# Patient Record
Sex: Female | Born: 1990 | Race: Black or African American | Hispanic: No | Marital: Single | Smoking: Never smoker
Health system: Southern US, Community
[De-identification: ages and names within clinical notes are randomized; demographics above are authoritative.]

## PROBLEM LIST (undated history)

## (undated) DIAGNOSIS — R011 Cardiac murmur, unspecified: Secondary | ICD-10-CM

---

## 2018-04-18 NOTE — L&D Delivery Note (Addendum)
Date of delivery: 09/12/18 Estimated Date of Delivery: 09/04/18 No LMP recorded (lmp unknown). patient dated by 29wk ultrasound, LMP estimated 11/28/17 EGA: [redacted]w[redacted]d  Delivery Note At 2:55 PM a viable female was delivered via Vaginal, Spontaneous (Presentation: cephalic; LOA).  APGAR: 8, 9; weight 7 lb 5.5 oz (3330 g).   Placenta status: sponatneous, intact with trailing membranes removed completely.  Cord: 3vv, with the following complications: wrapped around body. Cord pH: not collected  Anesthesia: none (stadol during labor, lidocaine for repair)  Episiotomy: None Lacerations: 2nd degree;Perineal; Left sulcal Suture Repair: 3.0 vicryl vicryl rapide Est. Blood Loss (mL): 1292cc (measured) and primarily from vaginal laceration prior to delivery, not uterus.  Mom presented to L&D with induction of labor for past due date.  She was given cytotec x2, AROM'd for clear fluid, and then at 9.5cm, augmented with pitocin for protracted labor. Progressed to complete, second stage: 2hr 30 minutes.  Effective pushing when pushing efforts were maximized, which occurred irregularly. 3+ station for about 1 hour, with bleeding from vaginal laceration during second stage.  delivery of fetal head with restitution to LOT.Marland Kitchen   Anterior then posterior shoulders delivered without difficulty.  Body cord was unwound, and baby placed on mom's chest, and attended to by peds.   Cord was then clamped and cut when pulseless.  Placenta spontaneously delivered, and trailing membranes were removed in whole using ringed forceps.   IV pitocin given for hemorrhage prophylaxis. 3-0 vicryl rapide was used to repair a hemorrhagic left sulcal tear which was the main source of her postpartum bleeding until repair was complete. Uterine tone was firm throughout and did not hemorrhage. Shallow 2nd degree perineal was repaired with 3-0 vicryl.     We sang happy birthday to baby Anjel.   Mom to postpartum.  Baby to Couplet care / Skin to  Skin.  ----- Ranae Plumber, MD, FACOG Attending Obstetrician and Gynecologist [facility], Department of OB/GYN [facility]

## 2018-05-17 ENCOUNTER — Other Ambulatory Visit: Payer: Self-pay | Admitting: Family Medicine

## 2018-05-17 DIAGNOSIS — O4690 Antepartum hemorrhage, unspecified, unspecified trimester: Secondary | ICD-10-CM

## 2018-05-22 ENCOUNTER — Ambulatory Visit: Admission: RE | Admit: 2018-05-22 | Payer: Medicaid Other | Source: Ambulatory Visit

## 2018-05-29 ENCOUNTER — Other Ambulatory Visit: Payer: Self-pay

## 2018-05-29 ENCOUNTER — Emergency Department
Admission: EM | Admit: 2018-05-29 | Discharge: 2018-05-29 | Disposition: A | Discharge status: Home / Self Care | Payer: Medicaid Other | Attending: Student in an Organized Health Care Education/Training Program | Admitting: Student in an Organized Health Care Education/Training Program

## 2018-05-29 ENCOUNTER — Encounter: Payer: Self-pay | Admitting: Emergency Medicine

## 2018-05-29 DIAGNOSIS — Z3A00 Weeks of gestation of pregnancy not specified: Secondary | ICD-10-CM | POA: Diagnosis not present

## 2018-05-29 DIAGNOSIS — L02214 Cutaneous abscess of groin: Secondary | ICD-10-CM | POA: Insufficient documentation

## 2018-05-29 DIAGNOSIS — L0291 Cutaneous abscess, unspecified: Secondary | ICD-10-CM

## 2018-05-29 DIAGNOSIS — O99719 Diseases of the skin and subcutaneous tissue complicating pregnancy, unspecified trimester: Secondary | ICD-10-CM | POA: Insufficient documentation

## 2018-05-29 MED ORDER — MUPIROCIN 2 % EX OINT: TOPICAL_OINTMENT | CUTANEOUS | 0 refills | Status: DC

## 2018-05-29 MED ORDER — LIDOCAINE-PRILOCAINE 2.5-2.5 % EX CREA
TOPICAL_CREAM | Freq: Once | CUTANEOUS | Status: AC
  Administered 2018-05-29: 1 via TOPICAL

## 2018-05-29 MED ORDER — LIDOCAINE HCL (PF) 1 % IJ SOLN
5.0000 mL | Freq: Once | INTRAMUSCULAR | Status: DC
  Filled 2018-05-29: qty 5

## 2018-05-29 MED ORDER — LIDOCAINE 4 % EX CREA: TOPICAL_CREAM | Freq: Once | CUTANEOUS | Status: DC

## 2018-05-29 MED ORDER — LIDOCAINE-PRILOCAINE 2.5-2.5 % EX CREA
TOPICAL_CREAM | CUTANEOUS | Status: AC
  Administered 2018-05-29: 1 via TOPICAL
  Filled 2018-05-29: qty 5

## 2018-05-29 MED ORDER — CEPHALEXIN 500 MG PO CAPS: 500.0000 mg | ORAL_CAPSULE | Freq: Three times a day (TID) | ORAL | 0 refills | Status: AC

## 2018-05-29 NOTE — ED Triage Notes (Signed)
Pt w/ mother [facility] from [facility] with abscess to inner thigh x2 days, denies fever or drainage. PT mother states she is pregnant but unsure how far along, " maybe 4-795months" per mother . Mother is legal guardian

## 2018-05-29 NOTE — ED Provider Notes (Signed)
[facility] -  Emergency Department Provider Note    First MD Initiated Contact with Patient 05/29/18 1208     (approximate)  I have reviewed the triage vital signs and the nursing notes.   HISTORY  Chief Complaint Abscess    HPI Stacy Hess is a 28 y.o. female with some chronic intellectual disability presents from PCP clinic for evaluation of left groin abscess that is been there for several days.  No fevers.  Just complaining of mild to moderate pain.  Also was seeing her PCP for establishment of care in pregnancy.  She denies any pelvic pain at this point.  No bleeding.  Thinks that she is roughly [redacted] weeks pregnant.  History reviewed. No pertinent past medical history. No family history on file. History reviewed. No pertinent surgical history. There are no active problems to display for this patient.     Prior to Admission medications   Medication Sig Start Date End Date Taking? Authorizing Provider  cephALEXin (KEFLEX) 500 MG capsule Take 1 capsule (500 mg total) by mouth 3 (three) times daily for 7 days. 05/29/18 06/05/18  Willy Eddy, MD  mupirocin ointment Idelle Jo) 2 % Apply to affected area 3 times daily 05/29/18 05/29/19  Willy Eddy, MD    Allergies Patient has no known allergies.    Social History Social History   Tobacco Use  . Smoking status: Not on file  Substance Use Topics  . Alcohol use: Not on file  . Drug use: Not on file    Review of Systems Patient denies headaches, rhinorrhea, blurry vision, numbness, shortness of breath, chest pain, edema, cough, abdominal pain, nausea, vomiting, diarrhea, dysuria, fevers, rashes or hallucinations unless otherwise stated above in HPI. ____________________________________________   PHYSICAL EXAM:  VITAL SIGNS: Vitals:   05/29/18 1135  BP: 105/65  Pulse: (!) 103  Resp: 16  Temp: 98.1 F (36.7 C)  SpO2: 100%    Constitutional: Alert and oriented. Well appearing  and in no acute distress. Eyes: Conjunctivae are normal.  Head: Atraumatic. Nose: No congestion/rhinnorhea. Mouth/Throat: Mucous membranes are moist.   Neck: Painless ROM.  Cardiovascular:   Good peripheral circulation. Respiratory: Normal respiratory effort.  No retractions.  Gastrointestinal: Soft and nontender.  Gravid Musculoskeletal: No lower extremity tenderness .  No joint effusions. Neurologic:  Normal speech and language. No gross focal neurologic deficits are appreciated.  Skin:  Skin is warm, dry and intact 3 cm fluctuant abscess in the left groin area lateral to the vulva Psychiatric: Mood and affect are normal. Speech and behavior are normal.  ____________________________________________   LABS (all labs ordered are listed, but only abnormal results are displayed)  No results found for this or any previous visit (from the past 24 hour(s)). ____________________________________________ ____________________________________________  RADIOLOGY  EMERGENCY DEPARTMENT [place] PREGNANCY "Study: Limited Ultrasound of the Pelvis for Pregnancy"  INDICATIONS:Pregnancy(required) Multiple views of the uterus and pelvic cavity were obtained in real-time with a multi-frequency probe.  APPROACH:Transabdominal  PERFORMED BY: Myself IMAGES ARCHIVED?: yes LIMITATIONS: none PREGNANCY FREE FLUID: Present ADNEXAL FINDINGS: GESTATIONAL AGE, ESTIMATE:  FETAL HEART RATE: 150 INTERPRETATION: reassuring fetal heart rate.  Reassuring fetal movement     ____________________________________________   PROCEDURES  Procedure(s) performed:  Marland KitchenMarland KitchenIncision and Drainage Date/Time: 05/29/2018 1:43 PM Performed by: Willy Eddy, MD Authorized by: Willy Eddy, MD   Consent:    Consent obtained:  Verbal   Consent given by:  Patient   Risks discussed:  Bleeding, infection, incomplete drainage and pain   Alternatives  discussed:  Alternative treatment, delayed treatment and  observation Location:    Type:  Abscess   Location:  Lower extremity   Lower extremity location:  Leg   Leg location:  L upper leg Anesthesia (see MAR for exact dosages):    Anesthesia method:  Local infiltration   Local anesthetic:  Lidocaine 1% w/o epi Procedure type:    Complexity:  Complex Procedure details:    Incision types:  Stab incision   Incision depth:  Subcutaneous   Scalpel blade:  11   Wound management:  Probed and deloculated   Drainage:  Purulent   Drainage amount:  Moderate   Wound treatment:  Wound left open   Packing materials:  None Post-procedure details:    Patient tolerance of procedure:  Tolerated well, no immediate complications      Critical Care performed: no ____________________________________________   INITIAL IMPRESSION / ASSESSMENT AND PLAN / ED COURSE  Pertinent labs & imaging results that were available during my care of the patient were reviewed by me and considered in my medical decision making (see chart for details).  DDX: abscess, cellulitis, carbuncle, folliculitis  Stacy Hess is a 28 y.o. who presents to the ED with abscess to left groin.  Bedside ultrasound evaluate shows evidence of reassuring pregnancy.  She is not having any pelvic pain or bleeding.  Is established care with [facility] to clinic for further work-up of this.  Neuro visit to the ER today is regarding left groin abscess which was successfully incised and drained with moderate amount of purulent discharge.  Patient will be started on antibiotics.      ____________________________________________   FINAL CLINICAL IMPRESSION(S) / ED DIAGNOSES  Final diagnoses:  Abscess      NEW MEDICATIONS STARTED DURING THIS VISIT:  New Prescriptions   CEPHALEXIN (KEFLEX) 500 MG CAPSULE    Take 1 capsule (500 mg total) by mouth 3 (three) times daily for 7 days.   MUPIROCIN OINTMENT (BACTROBAN) 2 %    Apply to affected area 3 times daily     Note:  This document was  prepared using Dragon voice recognition software and may include unintentional dictation errors.     Willy Eddy, MD 05/29/18 1345

## 2018-05-29 NOTE — ED Notes (Signed)
RN called for patient in lobby, no response.

## 2018-05-29 NOTE — ED Notes (Signed)
Boil on leg needing to be drained per [facility] here via EMS. NAD.

## 2018-05-29 NOTE — ED Notes (Signed)
Discussed discharge instructions, prescriptions, and follow-up care with the patient and mother/legal guardian. No questions or concerns at this time. Pt stable at discharge.

## 2018-05-30 ENCOUNTER — Ambulatory Visit: Payer: Medicaid Other

## 2018-06-04 ENCOUNTER — Encounter: Payer: Self-pay | Admitting: *Deleted

## 2018-06-18 ENCOUNTER — Other Ambulatory Visit: Payer: Self-pay | Admitting: Family Medicine

## 2018-06-18 ENCOUNTER — Other Ambulatory Visit: Payer: Self-pay

## 2018-06-18 DIAGNOSIS — Z3490 Encounter for supervision of normal pregnancy, unspecified, unspecified trimester: Secondary | ICD-10-CM

## 2018-06-21 ENCOUNTER — Ambulatory Visit
Admission: RE | Admit: 2018-06-21 | Payer: Medicaid Other | Source: Ambulatory Visit | Attending: Family Medicine | Admitting: Family Medicine

## 2018-06-21 ENCOUNTER — Ambulatory Visit
Admission: RE | Admit: 2018-06-21 | Discharge: 2018-06-21 | Disposition: A | Discharge status: Home / Self Care | Payer: Medicaid Other | Source: Ambulatory Visit | Attending: Obstetrics and Gynecology | Admitting: Obstetrics and Gynecology

## 2018-06-21 DIAGNOSIS — Z3490 Encounter for supervision of normal pregnancy, unspecified, unspecified trimester: Secondary | ICD-10-CM

## 2018-06-21 DIAGNOSIS — O99419 Diseases of the circulatory system complicating pregnancy, unspecified trimester: Secondary | ICD-10-CM | POA: Insufficient documentation

## 2018-06-21 DIAGNOSIS — Q249 Congenital malformation of heart, unspecified: Secondary | ICD-10-CM

## 2018-06-21 HISTORY — DX: Cardiac murmur, unspecified: R01.1

## 2018-06-22 ENCOUNTER — Ambulatory Visit: Payer: Medicaid Other

## 2018-07-12 ENCOUNTER — Ambulatory Visit: Payer: Medicaid Other

## 2018-09-03 ENCOUNTER — Other Ambulatory Visit: Payer: Self-pay | Admitting: Certified Nurse Midwife

## 2018-09-03 ENCOUNTER — Encounter: Payer: Self-pay | Admitting: Certified Nurse Midwife

## 2018-09-03 NOTE — Progress Notes (Signed)
  Stacy Hess is a 28 y.o. G1P0 female at [redacted]w[redacted]d dated by [redacted]w[redacted]d ultrasound on 06/21/2018 with unknown LMP.    Pregnancy Issues: 1. Maternal ventral septal defect, seen by [facility] cardiology during pregnancy 2. Cognitive impairment 3. Anemia on iron 4. Rubella non-immune 5. Trich and gonorrhea 01/2018, treated; trich 07/2018, treated  [facility]ren[facility] care [facility]  Prenatal Labs: Blood type/Rh A+  Antibody screen neg  Rubella Non-immune  Varicella Immune  RPR NR  HBsAg Neg  HIV NR  GC neg  Chlamydia neg  Genetic screening Not performed  1 hour GTT 118  3 hour GTT n/a  GBS negative    Post Partum Planning: - Infant feeding: breast & formula - Contraception: Depo-Provera

## 2018-09-07 ENCOUNTER — Other Ambulatory Visit: Payer: Self-pay

## 2018-09-07 ENCOUNTER — Ambulatory Visit
Admission: RE | Admit: 2018-09-07 | Discharge: 2018-09-07 | Disposition: A | Discharge status: Home / Self Care | Payer: Medicaid Other | Source: Ambulatory Visit | Attending: Obstetrics & Gynecology | Admitting: Obstetrics & Gynecology

## 2018-09-07 DIAGNOSIS — Z1159 Encounter for screening for other viral diseases: Secondary | ICD-10-CM | POA: Diagnosis present

## 2018-09-08 LAB — NOVEL CORONAVIRUS, NAA (HOSP ORDER, SEND-OUT TO REF LAB; TAT 18-24 HRS): SARS-CoV-2, NAA: NOT DETECTED

## 2018-09-11 ENCOUNTER — Inpatient Hospital Stay
Admission: EM | Admit: 2018-09-11 | Discharge: 2018-09-14 | DRG: 806 | Disposition: A | Discharge status: Home / Self Care | Payer: Medicaid Other | Attending: Obstetrics and Gynecology | Admitting: Obstetrics and Gynecology

## 2018-09-11 ENCOUNTER — Other Ambulatory Visit: Payer: Self-pay

## 2018-09-11 DIAGNOSIS — Q219 Congenital malformation of cardiac septum, unspecified: Secondary | ICD-10-CM | POA: Diagnosis not present

## 2018-09-11 DIAGNOSIS — O480 Post-term pregnancy: Principal | ICD-10-CM | POA: Diagnosis present

## 2018-09-11 DIAGNOSIS — D62 Acute posthemorrhagic anemia: Secondary | ICD-10-CM | POA: Diagnosis not present

## 2018-09-11 DIAGNOSIS — Z3A41 41 weeks gestation of pregnancy: Secondary | ICD-10-CM

## 2018-09-11 DIAGNOSIS — R4189 Other symptoms and signs involving cognitive functions and awareness: Secondary | ICD-10-CM | POA: Diagnosis present

## 2018-09-11 DIAGNOSIS — O9989 Other specified diseases and conditions complicating pregnancy, childbirth and the puerperium: Secondary | ICD-10-CM | POA: Diagnosis present

## 2018-09-11 LAB — CBC
HCT: 29.3 % — ABNORMAL LOW (ref 36.0–46.0)
Hemoglobin: 9.6 g/dL — ABNORMAL LOW (ref 12.0–15.0)
MCH: 28 pg (ref 26.0–34.0)
MCHC: 32.8 g/dL (ref 30.0–36.0)
MCV: 85.4 fL (ref 80.0–100.0)
Platelets: 257 10*3/uL (ref 150–400)
RBC: 3.43 MIL/uL — ABNORMAL LOW (ref 3.87–5.11)
RDW: 12.8 % (ref 11.5–15.5)
WBC: 7.3 10*3/uL (ref 4.0–10.5)
nRBC: 0 % (ref 0.0–0.2)

## 2018-09-11 MED ORDER — OXYTOCIN 40 UNITS IN NORMAL SALINE INFUSION - SIMPLE MED
2.5000 [IU]/h | INTRAVENOUS | Status: DC
  Administered 2018-09-12: 2.5 [IU]/h via INTRAVENOUS

## 2018-09-11 MED ORDER — SOD CITRATE-CITRIC ACID 500-334 MG/5ML PO SOLN: 30.0000 mL | ORAL | Status: DC | PRN

## 2018-09-11 MED ORDER — LACTATED RINGERS IV SOLN
INTRAVENOUS | Status: DC
  Administered 2018-09-11 – 2018-09-12 (×2): via INTRAVENOUS

## 2018-09-11 MED ORDER — ACETAMINOPHEN 325 MG PO TABS: 650.0000 mg | ORAL_TABLET | ORAL | Status: DC | PRN

## 2018-09-11 MED ORDER — MISOPROSTOL 200 MCG PO TABS
ORAL_TABLET | ORAL | Status: AC
  Filled 2018-09-11: qty 4

## 2018-09-11 MED ORDER — OXYTOCIN 10 UNIT/ML IJ SOLN
INTRAMUSCULAR | Status: AC
  Filled 2018-09-11: qty 2

## 2018-09-11 MED ORDER — ONDANSETRON HCL 4 MG/2ML IJ SOLN: 4.0000 mg | Freq: Four times a day (QID) | INTRAMUSCULAR | Status: DC | PRN

## 2018-09-11 MED ORDER — LACTATED RINGERS IV SOLN: 500.0000 mL | INTRAVENOUS | Status: DC | PRN

## 2018-09-11 MED ORDER — MISOPROSTOL 100 MCG PO TABS
25.0000 ug | ORAL_TABLET | ORAL | Status: DC | PRN
  Administered 2018-09-12: 25 ug via VAGINAL
  Filled 2018-09-11 (×2): qty 1

## 2018-09-11 MED ORDER — OXYTOCIN BOLUS FROM INFUSION
500.0000 mL | Freq: Once | INTRAVENOUS | Status: AC
  Administered 2018-09-12: 500 mL via INTRAVENOUS

## 2018-09-11 MED ORDER — OXYTOCIN 40 UNITS IN NORMAL SALINE INFUSION - SIMPLE MED
INTRAVENOUS | Status: AC
  Administered 2018-09-12: 12:00:00 1 m[IU]/min via INTRAVENOUS
  Filled 2018-09-11: qty 1000

## 2018-09-11 MED ORDER — LIDOCAINE HCL (PF) 1 % IJ SOLN
30.0000 mL | INTRAMUSCULAR | Status: AC | PRN
  Administered 2018-09-12: 10 mL via SUBCUTANEOUS

## 2018-09-11 MED ORDER — BUTORPHANOL TARTRATE 1 MG/ML IJ SOLN
1.0000 mg | INTRAMUSCULAR | Status: DC | PRN
  Administered 2018-09-12 (×3): 1 mg via INTRAVENOUS
  Filled 2018-09-11 (×2): qty 1

## 2018-09-11 MED ORDER — MISOPROSTOL 100 MCG PO TABS
25.0000 ug | ORAL_TABLET | ORAL | Status: DC | PRN
  Administered 2018-09-12: 25 ug via BUCCAL
  Filled 2018-09-11 (×2): qty 1

## 2018-09-11 MED ORDER — LIDOCAINE HCL (PF) 1 % IJ SOLN
INTRAMUSCULAR | Status: AC
  Administered 2018-09-12: 15:00:00 10 mL via SUBCUTANEOUS
  Filled 2018-09-11: qty 30

## 2018-09-11 MED ORDER — TERBUTALINE SULFATE 1 MG/ML IJ SOLN: 0.2500 mg | Freq: Once | INTRAMUSCULAR | Status: DC | PRN

## 2018-09-11 MED ORDER — AMMONIA AROMATIC IN INHA
RESPIRATORY_TRACT | Status: AC
  Filled 2018-09-11: qty 10

## 2018-09-12 LAB — TYPE AND SCREEN
ABO/RH(D): A POS
Antibody Screen: NEGATIVE

## 2018-09-12 MED ORDER — DIPHENHYDRAMINE HCL 25 MG PO CAPS: 25.0000 mg | ORAL_CAPSULE | Freq: Four times a day (QID) | ORAL | Status: DC | PRN

## 2018-09-12 MED ORDER — HYDROCORTISONE (PERIANAL) 2.5 % EX CREA
TOPICAL_CREAM | CUTANEOUS | Status: DC
  Administered 2018-09-12: 19:00:00 via TOPICAL
  Filled 2018-09-12: qty 28.35

## 2018-09-12 MED ORDER — MEASLES, MUMPS & RUBELLA VAC IJ SOLR
0.5000 mL | Freq: Once | INTRAMUSCULAR | Status: AC
  Administered 2018-09-14: 0.5 mL via SUBCUTANEOUS
  Filled 2018-09-12 (×2): qty 0.5

## 2018-09-12 MED ORDER — ONDANSETRON HCL 4 MG PO TABS: 4.0000 mg | ORAL_TABLET | ORAL | Status: DC | PRN

## 2018-09-12 MED ORDER — WITCH HAZEL-GLYCERIN EX PADS
1.0000 "application " | MEDICATED_PAD | CUTANEOUS | Status: DC
  Administered 2018-09-12: 1 via TOPICAL
  Filled 2018-09-12: qty 100

## 2018-09-12 MED ORDER — OXYTOCIN 40 UNITS IN NORMAL SALINE INFUSION - SIMPLE MED
1.0000 m[IU]/min | INTRAVENOUS | Status: DC
  Administered 2018-09-12: 1 m[IU]/min via INTRAVENOUS

## 2018-09-12 MED ORDER — IBUPROFEN 600 MG PO TABS
600.0000 mg | ORAL_TABLET | Freq: Four times a day (QID) | ORAL | Status: DC
  Administered 2018-09-12 – 2018-09-14 (×8): 600 mg via ORAL
  Filled 2018-09-12 (×8): qty 1

## 2018-09-12 MED ORDER — SIMETHICONE 80 MG PO CHEW: 80.0000 mg | CHEWABLE_TABLET | ORAL | Status: DC | PRN

## 2018-09-12 MED ORDER — ONDANSETRON HCL 4 MG/2ML IJ SOLN: 4.0000 mg | INTRAMUSCULAR | Status: DC | PRN

## 2018-09-12 MED ORDER — DOCUSATE SODIUM 100 MG PO CAPS: 100.0000 mg | ORAL_CAPSULE | Freq: Two times a day (BID) | ORAL | Status: DC

## 2018-09-12 MED ORDER — ACETAMINOPHEN 500 MG PO TABS
1000.0000 mg | ORAL_TABLET | Freq: Four times a day (QID) | ORAL | Status: DC | PRN
  Administered 2018-09-12 – 2018-09-14 (×6): 1000 mg via ORAL
  Filled 2018-09-12 (×6): qty 2

## 2018-09-12 MED ORDER — MEDROXYPROGESTERONE ACETATE 150 MG/ML IM SUSP
150.0000 mg | INTRAMUSCULAR | Status: AC | PRN
  Administered 2018-09-14: 150 mg via INTRAMUSCULAR
  Filled 2018-09-12: qty 1

## 2018-09-12 MED ORDER — BENZOCAINE-MENTHOL 20-0.5 % EX AERO
1.0000 "application " | INHALATION_SPRAY | CUTANEOUS | Status: DC | PRN
  Administered 2018-09-12: 1 via TOPICAL
  Filled 2018-09-12: qty 56

## 2018-09-12 MED ORDER — COCONUT OIL OIL: 1.0000 "application " | TOPICAL_OIL | Status: DC | PRN

## 2018-09-12 MED ORDER — PRENATAL MULTIVITAMIN CH
1.0000 | ORAL_TABLET | Freq: Every day | ORAL | Status: DC
  Administered 2018-09-13 – 2018-09-14 (×2): 1 via ORAL
  Filled 2018-09-12 (×2): qty 1

## 2018-09-12 NOTE — Progress Notes (Signed)
Intrapartum progress note   S: uncomfortable with contractions but resting well in-between.   O: BP 126/78 (BP Location: Right Arm)   Pulse (!) 105   Temp 97.8 F (36.6 C) (Oral)   Resp 18   Ht 5\' 6"  (1.676 m)   Wt 79.8 kg   BMI 28.41 kg/m   FHT: 125 mod + accels and no decels TOCO: q3-5 min SVE: 9.5 (right sided lip), 100 / +1  A/P: 28yo G1P0 @ 41+1 with IOL for past due date.  1. IUP: category 1 2. IOL: s/p cytotec x2, AROM with protracted active labor, will start small dose of pitocin. 3. Declines epidural 4. Anticipate vaginal delivery  ----- Ranae Plumber, MD, FACOG Attending Obstetrician and Gynecologist [facility], Department of OB/GYN [facility]

## 2018-09-13 ENCOUNTER — Encounter: Payer: Self-pay | Admitting: Obstetrics & Gynecology

## 2018-09-13 DIAGNOSIS — R4189 Other symptoms and signs involving cognitive functions and awareness: Secondary | ICD-10-CM | POA: Diagnosis present

## 2018-09-13 DIAGNOSIS — D62 Acute posthemorrhagic anemia: Secondary | ICD-10-CM | POA: Diagnosis not present

## 2018-09-13 LAB — CBC
HCT: 23.7 % — ABNORMAL LOW (ref 36.0–46.0)
Hemoglobin: 7.6 g/dL — ABNORMAL LOW (ref 12.0–15.0)
MCH: 28.3 pg (ref 26.0–34.0)
MCHC: 32.1 g/dL (ref 30.0–36.0)
MCV: 88.1 fL (ref 80.0–100.0)
Platelets: 237 10*3/uL (ref 150–400)
RBC: 2.69 MIL/uL — ABNORMAL LOW (ref 3.87–5.11)
RDW: 13.2 % (ref 11.5–15.5)
WBC: 14.6 10*3/uL — ABNORMAL HIGH (ref 4.0–10.5)
nRBC: 0 % (ref 0.0–0.2)

## 2018-09-13 LAB — CHLAMYDIA/NGC RT PCR (ARMC ONLY)
Chlamydia Tr: NOT DETECTED
N gonorrhoeae: NOT DETECTED

## 2018-09-13 LAB — RAPID HIV SCREEN (HIV 1/2 AB+AG)
HIV 1/2 Antibodies: NONREACTIVE
HIV-1 P24 Antigen - HIV24: NONREACTIVE

## 2018-09-13 MED ORDER — DOCUSATE SODIUM 100 MG PO CAPS
200.0000 mg | ORAL_CAPSULE | Freq: Two times a day (BID) | ORAL | Status: DC
  Administered 2018-09-13 – 2018-09-14 (×2): 200 mg via ORAL
  Filled 2018-09-13 (×3): qty 2

## 2018-09-13 MED ORDER — VITAMIN C 500 MG PO TABS
500.0000 mg | ORAL_TABLET | Freq: Three times a day (TID) | ORAL | Status: DC
  Administered 2018-09-13 – 2018-09-14 (×6): 500 mg via ORAL
  Filled 2018-09-13 (×6): qty 1

## 2018-09-13 MED ORDER — FERROUS SULFATE 325 (65 FE) MG PO TABS
325.0000 mg | ORAL_TABLET | Freq: Three times a day (TID) | ORAL | Status: DC
  Administered 2018-09-13 – 2018-09-14 (×6): 325 mg via ORAL
  Filled 2018-09-13 (×6): qty 1

## 2018-09-13 NOTE — Progress Notes (Signed)
Post Partum Day 1  Subjective: No concerns. Ambulating without difficulty, pain managed with PO meds, tolerating regular diet, and voiding without difficulty.   No fever/chills, chest pain, shortness of breath, nausea/vomiting, or leg pain. No nipple or breast pain.   Objective: BP 114/82 (BP Location: Right Arm)   Pulse 99   Temp 98.4 F (36.9 C) (Oral)   Resp 18   Ht '5\' 6"'$  (1.676 m)   Wt 79.8 kg   LMP 05/29/2018   SpO2 99%   Breastfeeding Unknown   BMI 28.41 kg/m    Physical Exam:  General: cooperative and slowed mentation, short answers, difficult to assess if all information was understood Breasts: soft/nontender CV: RRR Pulm: nl effort, CTABL Abdomen: soft, non-tender, active bowel sounds Uterine Fundus: firm Lochia: appropriate DVT Evaluation: No evidence of DVT seen on physical exam. No cords or calf tenderness. No significant calf/ankle edema.  Recent Labs    09/11/18 2335 09/13/18 0544  HGB 9.6* 7.6*  HCT 29.3* 23.7*  WBC 7.3 14.6*  PLT 257 237    Assessment/Plan: 28 y.o. G1P1001 postpartum day # 1  -Continue routine postpartum care -Encouraged snug fitting bra, cold application, Tylenol PRN, and cabbage leaves for engorgement for formula feeding. -Desires injection of Depo-Provera for contraception prior to discharge, injection ordered.  -Acute blood loss anemia - asymptomatic; continue PO ferrous sulfate TID with stool softeners, add vitamin C to aid absorption, plan repeat CBC tomorrow morning  -Immunization status: Needs MMR prior to discharge -Social work consult ordered as patient has history of cognitive impairment and there are questions from both nursing and myself as to her ability to safely care for her newborn independently. Will continue to follow.   Disposition: Continue inpatient postpartum care    LOS: 2 days   Lisette Grinder, CNM 09/13/2018, 8:48 AM   ----- Lisette Grinder Certified Nurse Midwife [facility], [place]

## 2018-09-13 NOTE — Clinical Social Work Maternal (Signed)
CLINICAL SOCIAL WORK MATERNAL/CHILD NOTE  Patient Details  Name: Sanjuana MaeJanice Raider MRN: [id number] Date of Birth: 1990-06-02  Date:  09/13/2018  Clinical Social Worker Initiating Note:  York SpanielMonica Jaylissa Felty MSW,LCSW Date/Time: Initiated:  09/13/18/      Child's Name:      Biological Parents:  Mother   Need for Interpreter:  None   Reason for Referral:  Competency/Guardianship    Address:  [address] [place] [address]    Phone number:  [phone] (home)     Additional phone number: none  Household Members/Support Persons (HM/SP):       HM/SP Name Relationship DOB or Age  HM/SP -1        HM/SP -2        HM/SP -3        HM/SP -4        HM/SP -5        HM/SP -6        HM/SP -7        HM/SP -8          Natural Supports (not living in the home):      Professional Supports: None   Employment: Unemployed   Type of Work:     Education:      Homebound arranged:    Surveyor, quantityinancial Resources:  Medicaid   Other Resources:      Cultural/Religious Considerations Which May Impact Care:  none  Strengths:  Home prepared for child    Psychotropic Medications:         Pediatrician:       [place]:   [place]    [place]    [place]    [place]    [place]    [place]      Pediatrician Fax Number:    Risk Factors/Current Problems:  Intellectual CounsellorDevelopment Disorder    Cognitive State:  Alert    Mood/Affect:  Calm , Flat    CSW Assessment: CSW received consult due to concern that patient will not be able to care for her newborn. CSW spoke with patient's nurse prior to assessment and was informed that patient has a cognitive delay and has not been able to care for her newborn. Patient resides with her mother in the home and patient's mother was in the room at time of assessment. Patient was not very talkative and patient's mother was asleep when CSW first entered room. CSW awakened patient's mother and she was  cooperative with the assessment. She states that patient is one of 6 children and the only one of her children with a cognitive delay. Patient's mother states she has now 15 grandchildren. Patient's mother states she will be the primary caregiver for patient's newborn. CSW explained that staff would need to see her demonstrate her ability to care for patient's newborn and she verbalized understanding. Patient's mother explained that patient was up in [place] for 2 months visiting her maternal grandmother and that this is when she became pregnant. Patient's mother stated she was not supervised and apparently was going out and having sex often. Patient's mother states the father of the newborn is unknown. Patient's mother stated that they have all necessities for her newborn. They have transportation as well. Patient's mother states patient does not have a history of depression or anxiety but will monitor her for postpartum depression. No further questions by patient's mother at this time.    CSW Plan/Description:  No Further Intervention Required/No Barriers to Discharge  York Spaniel, [place] 09/13/2018, 11:26 AM

## 2018-09-13 NOTE — H&P (Signed)
OB ADMISSION/ HISTORY & PHYSICAL:  Admission Date: 09/11/2018 10:37 PM  Admit Diagnosis: induction of labor  Stacy Hess is a 28 y.o. G1P1001 presenting for iol for postdates.  Prenatal History: G1P1001   EDC : 09/06/2018, delivered 09/12/18  Prenatal care at [facility] Prenatal course complicated by   Pregnancy Issues: 1. Maternal ventral septal defect, seen by [facility] cardiology during pregnancy 2. Cognitive impairment 3. Anemia on iron 4. Rubella non-immune 5. Trich and gonorrhea 01/2018, treated; trich 07/2018, treated  Medical / Surgical History :  Past medical history:  Past Medical History:  Diagnosis Date  . Murmur      Past surgical history: History reviewed. No pertinent surgical history.  Family History:  Family History  Problem Relation Age of Onset  . Diabetes Mother   . Hypertension Father   . Diabetes Maternal Grandmother   . Hypertension Maternal Grandmother   . Hypertension Maternal Grandfather   . Seizures Paternal Aunt      Social History:  reports that she has never smoked. She has never used smokeless tobacco. She reports that she does not drink alcohol or use drugs.   Allergies: Patient has no known allergies.    Current Medications at time of admission:  Prior to Admission medications   Medication Sig Start Date End Date Taking? Authorizing Provider  Prenatal Vit-Fe Fumarate-FA (PRENATAL MULTIVITAMIN) TABS tablet Take 1 tablet by mouth daily at 12 noon.   Yes [provider]      Physical Exam:  VS: Blood pressure 126/84, pulse (!) 103, temperature 98.4 F (36.9 C), temperature source Oral, resp. rate 18, height 5\' 6"  (1.676 m), weight 79.8 kg, last menstrual period 05/29/2018, SpO2 100 %, unknown if currently breastfeeding.    FHT: 135, moderate variability, +accels, no decels TOCO: none SVE:  Closed/thick/high   Cephalic by leopolds  Prenatal Labs: Blood type/Rh --/--/A POS (05/27 0043)  Antibody screen neg  Rubella  Non-Immune  Varicella Immune  RPR NR  HBsAg Neg  HIV NR  GC neg  Chlamydia neg  Genetic screening Not performed  1 hour GTT 118  3 hour GTT n/a  GBS negative   No results found.  Assessment: 41+[redacted] weeks gestation FHR category Cat I   Plan:   Admit for induction of labor Labs pending Epidural when desired Continuous fetal monitoring  2. Routine OB: - Prenatal labs reviewed, as above - Rh positive  3. Post Partum Planning: - Infant feeding: breast and formula - Contraception: depoprovera  4. Induction of Labor:  -  Contractions external toco in place -  Plan for induction with cytotec

## 2018-09-13 NOTE — Progress Notes (Signed)
Infant cared for mostly by grandmother and myself during my shift. When grandmother asked patient if she wanted to hold her baby, she shook her head "no" as if she was too afraid. Grandmother anxious about infant being "spitty" and wondering if the infant does not like the formula. Educated grandmother and mother on what to do when infant is spitting up mucous. Grandmother anxious to feed infant, asks RN to assist with two feeds. Infant very spitty, but takes 25ml, then 84ml  for this RN though she spits up most of what she takes in. At 4 o'clock feeding, after this RN fed infant 61ml, this RN asked mother if she would like to try to feed infant. Mother agreed. While supervising this feed, mother placed nipple in infants mouth and looked away. Mother is easily distracted while holding infant. This RN noticed that infant was gagging and sat infant up. Mother of infant did not notice until then and did not know what to do when infant was gagging. This RN then placed infant on stomach on mothers shoulder and taught her how to burp her. RN had to reinforce several times for mother to hold her with both hands. Mother seems uncomfortable holding infant but does show interest in infant. Upon walking back in the room, infant crying. When this RN asked mother, "What's wrong is she still hungry?", patient stated, "I don't know what's wrong with her I just know she's crying." Educated mother on feeding cues. Grandmother took infant from mother and tried to feed her again. Will continue to monitor. Otilio Jefferson, RN

## 2018-09-13 NOTE — Progress Notes (Signed)
Mom demonstrated interest in infant today. Held infant most of the afternoon and was able to pick up on signs such as the infant spitting up and needing assistance. Definitely an improvement from yesterday. Grandmother of infant did all the feedings and diaper changes and also showed interest in infant. Grandmother supported and encouraged mom in holding and bonding with the infant.  

## 2018-09-13 NOTE — Discharge Summary (Signed)
Obstetrical Discharge Summary  Patient Name: Stacy Hess DOB: 10-Nov-1990 MRN: [id number]  Date of Admission: 09/11/2018 Date of Delivery: 09/12/18 Delivered by: Larey Days, MD Date of Discharge: 09/14/2018  Primary OB:  Princella Ion  [id number]'D last menstrual period was 05/29/2018. Extrapolated from 29wk ultrasound: 11/28/17 EDC Estimated Date of Delivery: 09/04/18 Gestational Age at Delivery: [redacted]w[redacted]d  Antepartum complications:  Pregnancy Issues: 1. [facility] 2. Cognitive impairment 3. Anemia on iron 4. Rubella non-immune 5. Trich and gonorrhea 01/2018, treated; trich 07/2018, treated  Admitting Diagnosis: induction of labor for past due date Secondary Diagnosis: Patient Active Problem List   Diagnosis Date Noted  . Acute blood loss anemia 09/13/2018  . Postpartum hemorrhage of vagina 09/13/2018  . Cognitive impairment 09/13/2018  . Labor and delivery indication for care or intervention 09/11/2018  . Maternal congenital heart disease, antepartum 06/21/2018    Augmentation: AROM, Pitocin and Cytotec Complications: [id number] complications/course: Mom presented to L&D with induction of labor for past due date.  She was given cytotec x2, AROM'd for clear fluid, and then at 9.5cm, augmented with pitocin for protracted labor. Progressed to complete, second stage: 2hr 30 minutes.  Effective pushing when pushing efforts were maximized, which occurred irregularly. 3+ station for about 1 hour, with bleeding from vaginal laceration during second stage.  delivery of fetal head with restitution to LOT..Marland Kitchen  Anterior then posterior shoulders delivered without difficulty.  Body cord was unwound, and baby placed on mom's chest, and attended to by peds.   Cord was then clamped and cut when pulseless.  Placenta spontaneously delivered, and trailing membranes were removed in whole using ringed forceps.   IV pitocin given  for hemorrhage prophylaxis. 3-0 vicryl rapide was used to repair a hemorrhagic left sulcal tear which was the main source of her postpartum bleeding until repair was complete. Uterine tone was firm throughout and did not hemorrhage. Shallow 2nd degree perineal was repaired with 3-0 vicryl.     Date of Delivery: 09/12/18 Delivered By: CVikki PortsWard Delivery Type: spontaneous vaginal delivery Anesthesia: none Placenta: spontaneous Laceration: 2nd degree + left sulcal Episiotomy: none Newborn Data: Live born female "Anjel" Birth Weight: 7 lb 5.5 oz (3330 g) APGAR: 8, 9  Newborn Delivery   Birth date/time:  09/12/2018 14:55:00 Delivery type:  Vaginal, Spontaneous     Postpartum Procedures: MMR, depo  Post partum course:  Patient had an postpartum course complicated by acute blood loss anemia.  By time of discharge on PPD#1, her pain was controlled on oral pain medications; she had appropriate lochia and was ambulating, voiding without difficulty and tolerating regular diet.  She was deemed stable for discharge to home.     Discharge Physical Exam:  BP 127/79 (BP Location: Right Arm)   Pulse 96   Temp 98.3 F (36.8 C) (Oral)   Resp 16   Ht '5\' 6"'$  (1.676 m)   Wt 79.8 kg   LMP 05/29/2018   SpO2 100%   Breastfeeding Unknown   BMI 28.41 kg/m   General: NAD CV: RRR Pulm: CTABL, nl effort ABD: s/nd/nt, fundus firm and below the umbilicus Lochia: moderate DVT Evaluation: LE non-ttp, no evidence of DVT on exam.  Hemoglobin  Date Value Ref Range Status  09/14/2018 7.2 (L) 12.0 - 15.0 g/dL Final   HCT  Date Value Ref Range Status  09/14/2018 23.1 (L) 36.0 - 46.0 % Final     Disposition: stable, discharge to home. Baby Feeding:  breastmilk and formula Baby Disposition: home with mom  Rh Immune globulin given: n/a Rubella vaccine given: given Tdap vaccine given in AP or PP setting: APAP Flu vaccine given in AP or PP setting:   Contraception: depo-provera  Prenatal  Labs:   Blood type/Rh A+  Antibody screen neg  Rubella Non-immune  Varicella Immune  RPR NR  HBsAg Neg  HIV NR  GC neg  Chlamydia neg  Genetic screening Not performed  1 hour GTT 118  3 hour GTT n/a  GBS negative     Plan:  Stacy Hess was discharged to home in good condition. Follow-up appointment with Dr. Leonides Schanz in 6 weeks.  Discharge Medications: Allergies as of 09/14/2018   No Known Allergies     Medication List    TAKE these medications   docusate sodium 100 MG capsule Commonly known as:  Colace Take 1 capsule (100 mg total) by mouth 2 (two) times daily for 14 days. To keep stools soft, as needed   ferrous sulfate 325 (65 FE) MG tablet Take 1 tablet (325 mg total) by mouth daily with breakfast. Take with Vitamin C   ibuprofen 800 MG tablet Commonly known as:  ADVIL Take 1 tablet (800 mg total) by mouth every 8 (eight) hours as needed for moderate pain or cramping.   prenatal multivitamin Tabs tablet Take 1 tablet by mouth daily at 12 noon.         Signed: Benjaman Kindler 09/14/18

## 2018-09-14 LAB — CBC
HCT: 23.1 % — ABNORMAL LOW (ref 36.0–46.0)
Hemoglobin: 7.2 g/dL — ABNORMAL LOW (ref 12.0–15.0)
MCH: 27.8 pg (ref 26.0–34.0)
MCHC: 31.2 g/dL (ref 30.0–36.0)
MCV: 89.2 fL (ref 80.0–100.0)
Platelets: 211 10*3/uL (ref 150–400)
RBC: 2.59 MIL/uL — ABNORMAL LOW (ref 3.87–5.11)
RDW: 13.2 % (ref 11.5–15.5)
WBC: 9.6 10*3/uL (ref 4.0–10.5)
nRBC: 0 % (ref 0.0–0.2)

## 2018-09-14 LAB — RPR: RPR Ser Ql: NONREACTIVE

## 2018-09-14 MED ORDER — FERROUS SULFATE 325 (65 FE) MG PO TABS: 325.0000 mg | ORAL_TABLET | Freq: Every day | ORAL | 1 refills | Status: AC

## 2018-09-14 MED ORDER — IBUPROFEN 800 MG PO TABS: 800.0000 mg | ORAL_TABLET | Freq: Three times a day (TID) | ORAL | 1 refills | Status: AC | PRN

## 2018-09-14 MED ORDER — DOCUSATE SODIUM 100 MG PO CAPS: 100.0000 mg | ORAL_CAPSULE | Freq: Two times a day (BID) | ORAL | 0 refills | Status: AC

## 2018-09-14 NOTE — Progress Notes (Signed)
DSS (morgan) came to [facility] to speak with patient and grandmother, grandmother got defensive and wondered who made the referral. She stated she was caring for the infant the entire time. I spoke with DSS and she said they were fine to go home, but they were going to follow up out patient. The patient and grandmother and infant were actually planning on ubering home due to lack of transportation but DSS (morgan) offered to take them home.   Discharge teaching done with mom and grandmother. HEAVY. Lack of attention was noted by both (they were busy trying to dress the infant). I repeated myself multiple times. The grandmother stated she understood and had no questions. She asked for lots of supplies before they went home, I gave them to her and talked with her about contacting WIC to get milk for the infant. She said she understood. I also made sure that the mom and grandmother know that we don't give infant water. They verbalized understating. Infant will follow up on Monday June 1st. And mom will follow up on July 7th with dr ward. I explained to them where [facility] was located.   Patient and infant and grandmother discharged home   Dr beasley and dr dvergsten both notified  

## 2018-09-14 NOTE — Progress Notes (Signed)
Grandmother is legal guardian of mom houze)  Spoke with night shift RN about how the night went with the infant, mom, and grandmother. She said overall the baby was cared for by the grandmother (fed and changed) and mom bonded with the baby by holding it. However the grandmother was in and out of the hospital all night and was on the phone (speaker phone) loudly. She had got into it with security at some point about wearing a mask and kept going to the ED to smoke cigarettes. Also she said that her and the patient were arguing loudly in the room too. She thinks she heard something about the father of the baby being mentioned. She said the grandmother finally fell asleep around 3 am.   I spoke with Dr. Earnest Conroy and Dr. Dalbert Garnet about the grandmother adequately caring for the infant and they both agreed that a discharge on mom and infant was OK as long as CSW signed off on it. We are sill pending a UDS on infant due to late entry to prenatal care.   1334 writing this note: I and my coworkers have been in the room multiple times this morning and the grandmother of the infant has remained asleep all morning. She will stir around in the chair but does not get up or tend to the infant. Around 0830 the infant was showing signs of feeding cues and was past due to eat. Another RN was in the room doing vitals and fed the infant 20 mLS. Grandmother still did not wake up. I assessed the infant and shortly after gave her to mom (she wants to hold her, is bonding with her). About 30 min later the patient called out asking to put the baby back in the bassinet. Another RN went in the room and educated the mom on how to put the baby back in the bassinet if she feels comfortable. (grandmother still hasn't woken up or demonstrated signs of caring for the infant on my shift). About 1130 I go into the room and notice the infant showing feeding cues again. So I asked the mom if she would like to try to feed the baby, she stated yes.  So I filled the bottle up with of formula, handed her the baby, and told her to call me if she needed help. I would be back to check on her shortly. (grandmother still sleeping). At 1230 I reentered the room and asked the mom how the baby did with the feeding and she said good, ate the entire 20 mls and drank some water. I asked her when she gave water and she said before she gave the formula. I asked her how much she gave and she pointed to a decent amount in a coke bottle. I got out a gradufeed and told her to point to the amount. She pointed to . I educated her on not feeding the infant water and why. She was very apologetic and stated that she was so sorry she didn't know over and over again. (grandmother still asleep in the recliner). I continued my meds and vitals with her and left the room. I had to ask the grandmother a question about if the patient has ever had a reaction to a vaccine and she said no, then went back to sleep  Dr. Earnest Conroy notified and said there was not anything medically that we needed to do,  but we were going to discontinue the infants discharge. And she would report all of  this to Dr. Dierdre Highman (on call). For me to notify social work.   Monica, CSW called and I left a message with her upon no answer.    Will update dr. Dalbert Garnet once I talk to social work.

## 2018-09-14 NOTE — Clinical Social Work Note (Signed)
CSW has made a DSS CPS [facility] report to intake rep: Iantha Fallen. DSS is aware patient and newborn are scheduled to discharge today. York Spaniel MSW,LCSW [phone]

## 2018-09-14 NOTE — Progress Notes (Signed)
Spoke with Monica, CSW. She will make a CPS report and they will most likely follow up out patient in the home. Will notify pediatrician and OB.  

## 2019-03-16 IMAGING — US US MFM OB COMP +14 WKS
1 of 2 series · 12 of 28 positions shown · non-contrast
Comparison: none

PATIENT INFO:

PERFORMED BY:
                   Sonographer
SERVICE(S) PROVIDED:
 ----------------------------------------------------------------------
INDICATIONS:
  29 weeks gestation of pregnancy
  Estimate gestational age
  Anatomy
  Maternal VSD
FETAL EVALUATION:
 Num Of Fetuses:         1
 Fetal Heart Rate(bpm):  165
 Presentation:           Breech
 Placenta:               Posterior, No previa
 Amniotic Fluid
 AFI FV:      Within normal limits
 AFI Sum(cm)     %Tile       Largest Pocket(cm)
 9.36            7
 RUQ(cm)       RLQ(cm)       LUQ(cm)        LLQ(cm)
BIOMETRY:
 BPD:      69.8  mm     G. Age:  28w 0d          8  %    CI:        72.61   %    70 - 86
                                                         FL/HC:      23.0   %    19.6 -
 HC:      260.5  mm     G. Age:  28w 2d          5  %    HC/AC:      1.02        0.99 -
 AC:      256.1  mm     G. Age:  29w 6d         59  %    FL/BPD:     85.7   %    71 - 87
 FL:       59.8  mm     G. Age:  31w 1d         83  %    FL/AC:      23.4   %    20 - 24
 HUM:      53.9  mm     G. Age:  31w 2d         88  %
 Est. FW:    6506  gm      3 lb 4 oz     58  %
GESTATIONAL AGE:
 U/S Today:     29w 2d                                        EDD:   09/04/18
 Best:          29w 2d     Det. By:  U/S (06/21/18)           EDD:   09/04/18
ANATOMY:
 Cavum:                 CSP visualized         Aortic Arch:            Not visualized
 Ventricles:            Normal appearance      Ductal Arch:            Not visualized
 Choroid Plexus:        Within Normal Limits   Diaphragm:              Within Normal Limits
 Cerebellum:            Within Normal Limits   Stomach:                Seen
 Posterior Fossa:       Within Normal Limits   Abdomen:                Within Normal
                                                                       Limits
 Nuchal Fold:           Beyond 22 weeks        Abdominal Wall:         Not well seen
                        gestation
 Face:                  Orbits visualized      Cord Vessels:           3 vessels
 Lips:                  Normal appearance      Kidneys:                Normal appearance
 Thoracic:              Within Normal Limits   Bladder:                Seen
 Heart:                 4-Chamber view         Spine:                  Suboptimal views
                        appears normal
 RVOT:                  Normal appearance      Upper Extremities:      Visualized
 LVOT:                  Normal appearance      Lower Extremities:      Visualized
 Other:  Profile, Ao/Pa, bicaval and 3VT appear normal

[Series 1: us mfm ob comp +14 wks · 0.26mm/px · 55 acquisitions, 12 frames shown]
[im 3/55]
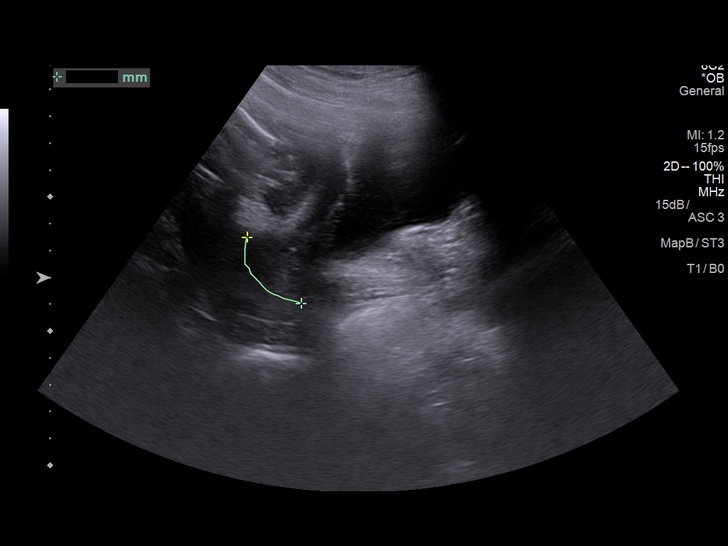
[im 7/55]
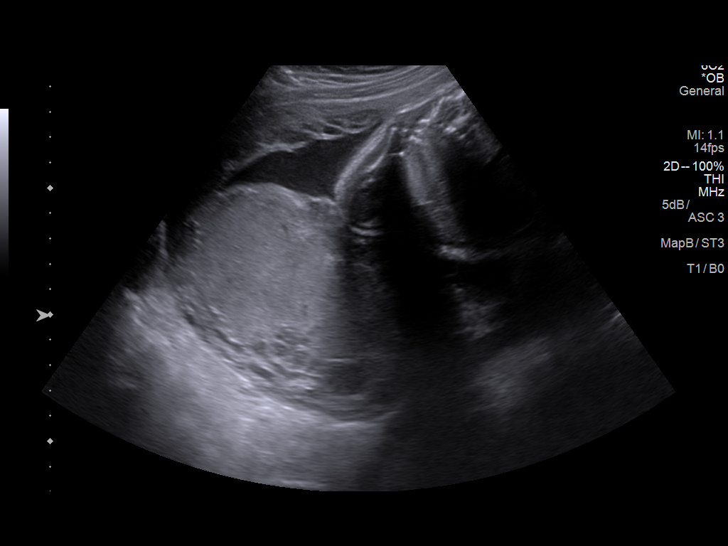
[im 11/55]
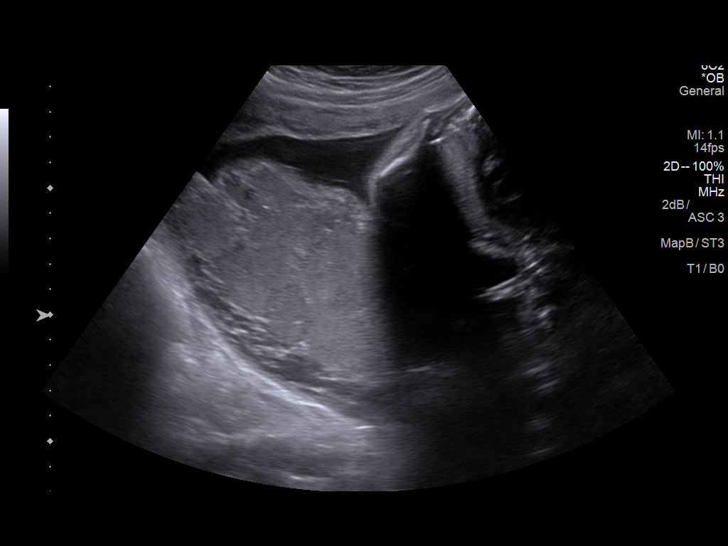
[im 17/55]
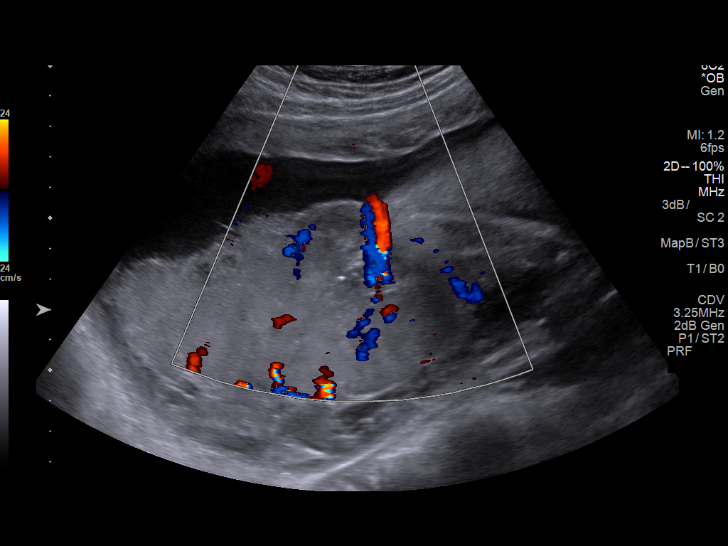
[im 21/55]
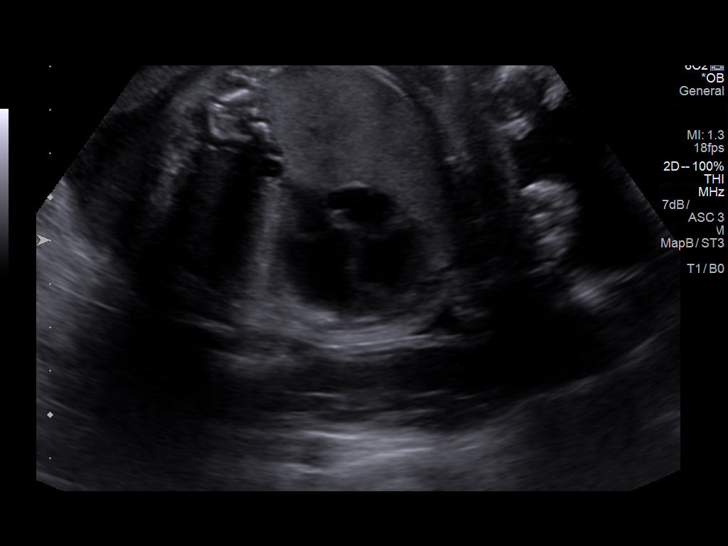
[im 25/55]
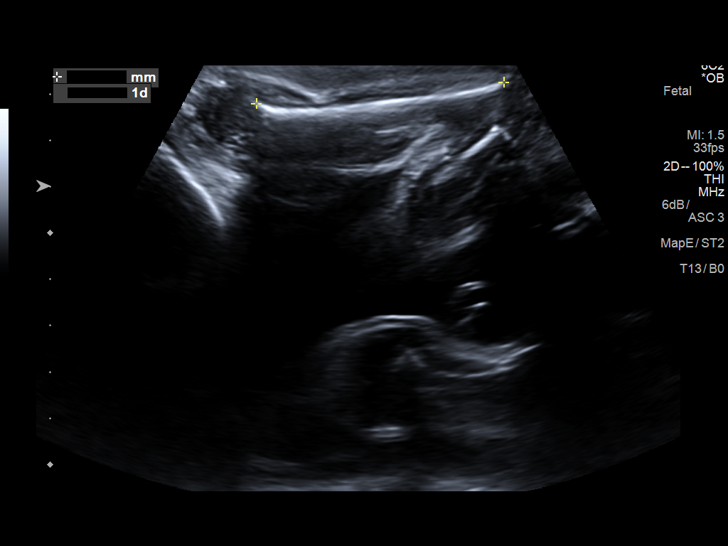
[im 32/55]
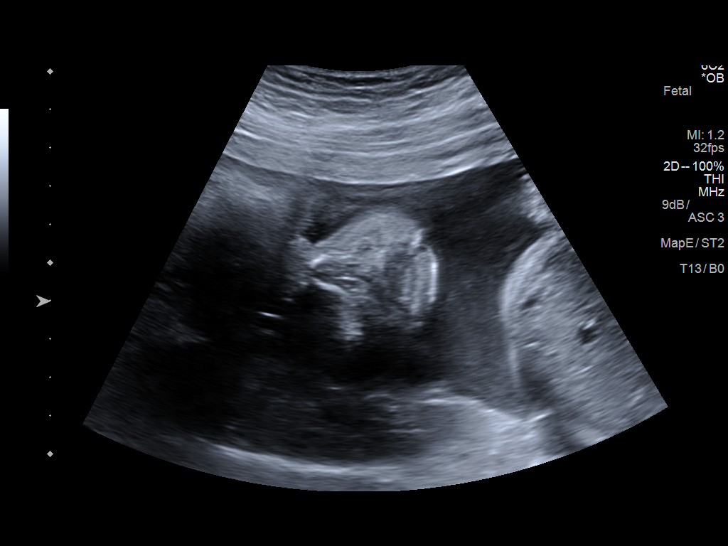
[im 36/55]
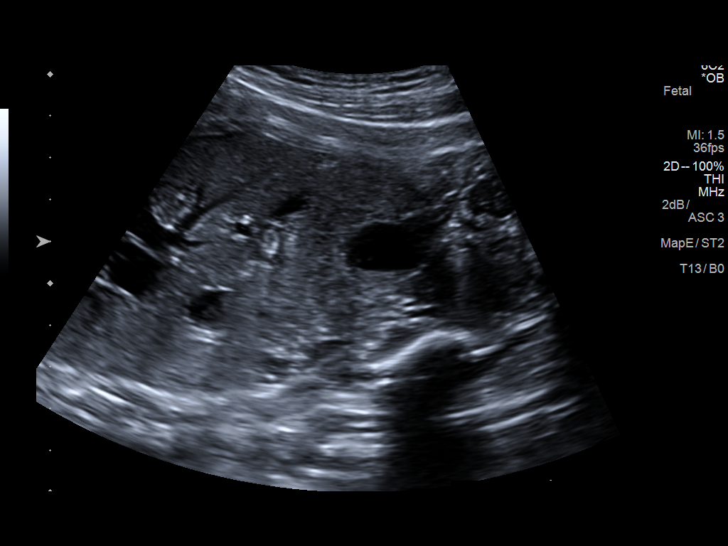
[im 40/55]
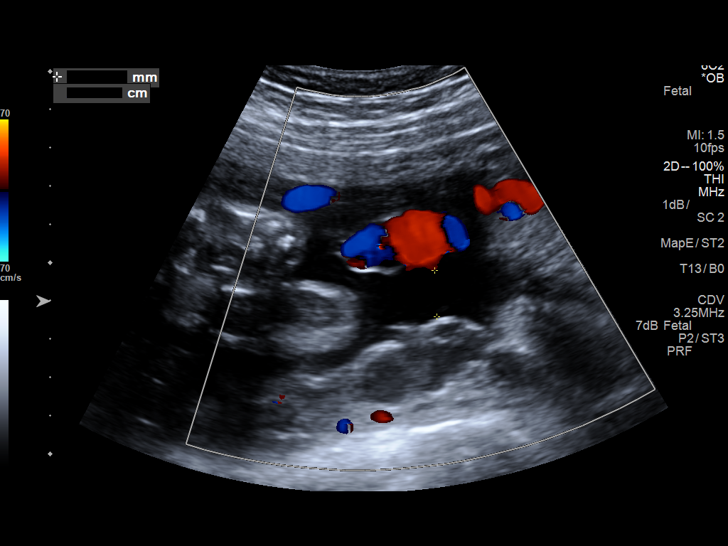
[im 46/55]
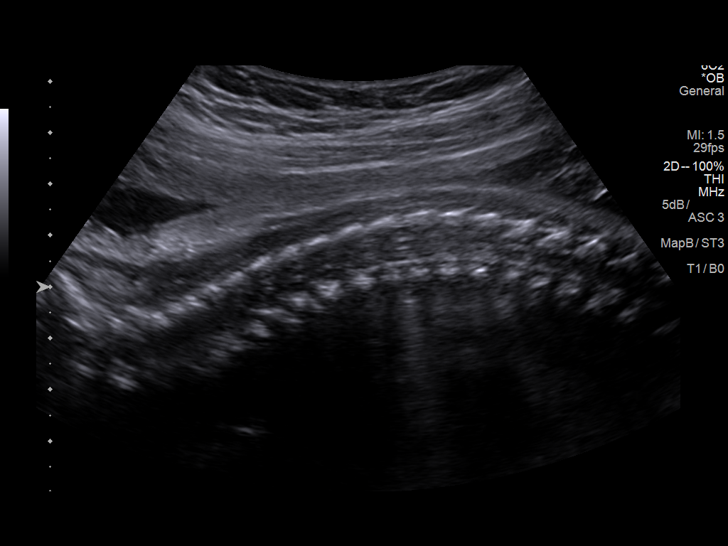
[im 50/55]
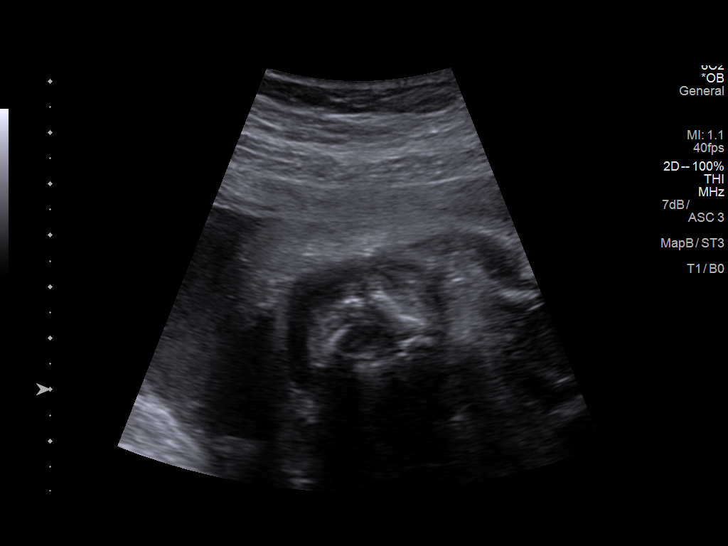
[im 55/55]
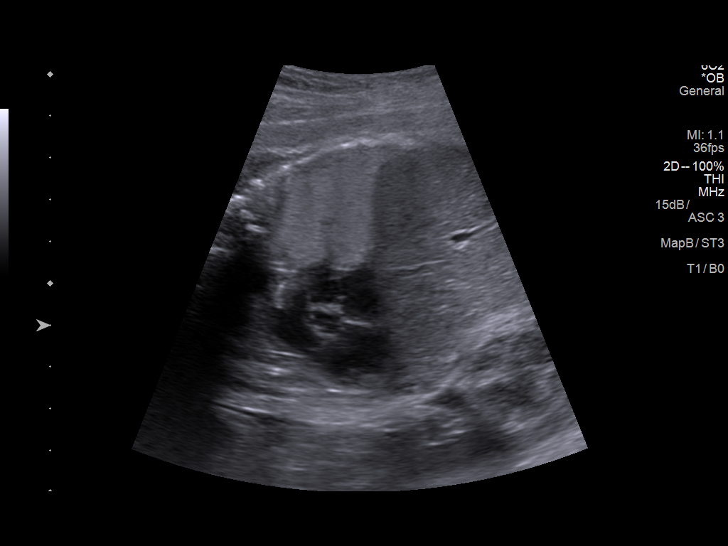

[12 of 28 positions shown; findings below may reference images not displayed]

IMPRESSION: Intrauterine pregnancy with a best estimated gestational age
 of 29 weeks 2 days.  Dating is based on today's ultrasound
 given unknown LMP.  Initial estimates of gestational age in
 the third trimester have a wide standard deviation.

 Detailed ultrasound performed for the indication of maternal
 VSD.

 Due to late gestational age and fetal position, views of the
 distal spine, abdominal cord insert, aortic arch and distal
 extremities were inadequately visualized.  Other fetal
 anatomy visualized appears normal within the limitations of
 ultrasound.

 Findings were discussed.

 Ms. Meliton had a maternal echocardiogram in June 2017
 which demonstrated a membranous VSD:  "MEMBRANOUS
 VSD WITH HIGH VELOCITY SUGGESTING LOW
 SIGNIFICANCE NOTED.
 POSITIVE BUBBLE STUDY".

 Recommend repeat echo given third trimester pregnancy.
 Would also recommend fetal echocardiogram and anesthesia
 consult. Likely, given absence of symptoms, small VSD will
 be well tolerated in pregnancy. VSDs become more
 concerning in pregnancy if they are associated with
 development of pulmonary hypertension.

 Will schedule MFM consult to follow maternal and fetal
 echocardiograms to review findings and suggested
 management.
                 Barbour, Montrell
